# Patient Record
Sex: Female | Born: 1935 | Race: Black or African American | Hispanic: No
Health system: Southern US, Community
[De-identification: ages and names within clinical notes are randomized; demographics above are authoritative.]

---

## 1997-10-30 ENCOUNTER — Ambulatory Visit (HOSPITAL_COMMUNITY): Admission: RE | Admit: 1997-10-30 | Discharge: 1997-10-30 | Payer: Self-pay | Admitting: General Surgery

## 1998-05-19 ENCOUNTER — Encounter (HOSPITAL_BASED_OUTPATIENT_CLINIC_OR_DEPARTMENT_OTHER): Payer: Self-pay | Admitting: General Surgery

## 1998-05-19 ENCOUNTER — Ambulatory Visit (HOSPITAL_COMMUNITY): Admission: RE | Admit: 1998-05-19 | Discharge: 1998-05-19 | Payer: Self-pay | Admitting: General Surgery

## 1999-12-04 ENCOUNTER — Emergency Department (HOSPITAL_COMMUNITY): Admission: EM | Admit: 1999-12-04 | Discharge: 1999-12-04 | Payer: Self-pay | Admitting: Emergency Medicine

## 2000-01-18 ENCOUNTER — Ambulatory Visit (HOSPITAL_COMMUNITY): Admission: RE | Admit: 2000-01-18 | Discharge: 2000-01-18 | Payer: Self-pay | Admitting: *Deleted

## 2000-01-18 ENCOUNTER — Encounter: Payer: Self-pay | Admitting: *Deleted

## 2001-12-20 ENCOUNTER — Encounter: Payer: Self-pay | Admitting: Orthopedic Surgery

## 2001-12-20 ENCOUNTER — Encounter: Admission: RE | Admit: 2001-12-20 | Discharge: 2001-12-20 | Payer: Self-pay | Admitting: Orthopedic Surgery

## 2002-01-18 ENCOUNTER — Encounter: Payer: Self-pay | Admitting: Neurosurgery

## 2002-01-18 ENCOUNTER — Encounter: Admission: RE | Admit: 2002-01-18 | Discharge: 2002-01-18 | Payer: Self-pay | Admitting: Neurosurgery

## 2002-02-03 ENCOUNTER — Emergency Department (HOSPITAL_COMMUNITY): Admission: EM | Admit: 2002-02-03 | Discharge: 2002-02-04 | Payer: Self-pay | Admitting: Emergency Medicine

## 2002-02-20 ENCOUNTER — Encounter: Admission: RE | Admit: 2002-02-20 | Discharge: 2002-02-20 | Payer: Self-pay | Admitting: Neurosurgery

## 2002-02-20 ENCOUNTER — Encounter: Payer: Self-pay | Admitting: Neurosurgery

## 2002-03-06 ENCOUNTER — Encounter: Admission: RE | Admit: 2002-03-06 | Discharge: 2002-03-06 | Payer: Self-pay | Admitting: Neurosurgery

## 2002-03-06 ENCOUNTER — Encounter: Payer: Self-pay | Admitting: Neurosurgery

## 2002-04-16 ENCOUNTER — Encounter: Payer: Self-pay | Admitting: *Deleted

## 2002-04-16 ENCOUNTER — Ambulatory Visit (HOSPITAL_COMMUNITY): Admission: RE | Admit: 2002-04-16 | Discharge: 2002-04-16 | Payer: Self-pay | Admitting: *Deleted

## 2002-04-26 ENCOUNTER — Encounter: Admission: RE | Admit: 2002-04-26 | Discharge: 2002-04-26 | Payer: Self-pay | Admitting: *Deleted

## 2002-04-26 ENCOUNTER — Encounter: Payer: Self-pay | Admitting: *Deleted

## 2002-09-25 ENCOUNTER — Encounter: Payer: Self-pay | Admitting: Neurosurgery

## 2002-09-25 ENCOUNTER — Ambulatory Visit (HOSPITAL_COMMUNITY): Admission: RE | Admit: 2002-09-25 | Discharge: 2002-09-25 | Payer: Self-pay | Admitting: Neurosurgery

## 2003-06-01 ENCOUNTER — Encounter: Admission: RE | Admit: 2003-06-01 | Discharge: 2003-06-01 | Payer: Self-pay | Admitting: Orthopedic Surgery

## 2004-11-05 ENCOUNTER — Encounter (HOSPITAL_COMMUNITY): Admission: RE | Admit: 2004-11-05 | Discharge: 2004-11-09 | Payer: Self-pay | Admitting: Nephrology

## 2004-12-27 ENCOUNTER — Emergency Department (HOSPITAL_COMMUNITY): Admission: EM | Admit: 2004-12-27 | Discharge: 2004-12-27 | Payer: Self-pay | Admitting: Emergency Medicine

## 2005-05-04 ENCOUNTER — Encounter (HOSPITAL_COMMUNITY): Admission: RE | Admit: 2005-05-04 | Discharge: 2005-08-02 | Payer: Self-pay | Admitting: Nephrology

## 2007-03-21 ENCOUNTER — Encounter: Admission: RE | Admit: 2007-03-21 | Discharge: 2007-03-21 | Payer: Self-pay | Admitting: Orthopedic Surgery

## 2007-03-22 ENCOUNTER — Ambulatory Visit (HOSPITAL_BASED_OUTPATIENT_CLINIC_OR_DEPARTMENT_OTHER): Admission: RE | Admit: 2007-03-22 | Discharge: 2007-03-22 | Payer: Self-pay | Admitting: Orthopedic Surgery

## 2008-02-29 ENCOUNTER — Ambulatory Visit: Payer: Self-pay | Admitting: Hematology

## 2008-03-13 ENCOUNTER — Ambulatory Visit (HOSPITAL_COMMUNITY): Admission: RE | Admit: 2008-03-13 | Discharge: 2008-03-13 | Payer: Self-pay | Admitting: Unknown Physician Specialty

## 2008-03-13 LAB — CBC & DIFF AND RETIC
Eosinophils Absolute: 0.1 10*3/uL (ref 0.0–0.5)
MONO#: 0.4 10*3/uL (ref 0.1–0.9)
NEUT#: 6.4 10*3/uL (ref 1.5–6.5)
RBC: 3.19 10*6/uL — ABNORMAL LOW (ref 3.70–5.32)
RDW: 13.8 % (ref 11.3–14.5)
RETIC #: 43.4 10*3/uL (ref 19.7–115.1)
Retic %: 1.4 % (ref 0.4–2.3)
WBC: 8 10*3/uL (ref 3.9–10.0)
lymph#: 1.1 10*3/uL (ref 0.9–3.3)

## 2008-03-13 LAB — MORPHOLOGY: PLT EST: ADEQUATE

## 2008-03-16 LAB — PROTEIN, URINE, 24 HOUR
Collection Interval-UPROT: 24 hours
Protein, 24H Urine: 10 mg/d — ABNORMAL LOW (ref 50–100)
Urine Total Volume-UPROT: 500 mL

## 2008-03-16 LAB — KAPPA/LAMBDA LIGHT CHAINS: Kappa free light chain: 1.3 mg/dL (ref 0.33–1.94)

## 2008-03-16 LAB — SPEP & IFE WITH QIG
Alpha-1-Globulin: 4.8 % (ref 2.9–4.9)
Gamma Globulin: 8.1 % — ABNORMAL LOW (ref 11.1–18.8)
IgA: 54 mg/dL — ABNORMAL LOW (ref 68–378)
IgM, Serum: 10 mg/dL — ABNORMAL LOW (ref 60–263)

## 2008-03-16 LAB — COMPREHENSIVE METABOLIC PANEL
AST: 16 U/L (ref 0–37)
Albumin: 4.7 g/dL (ref 3.5–5.2)
Alkaline Phosphatase: 56 U/L (ref 39–117)
BUN: 20 mg/dL (ref 6–23)
Potassium: 4.5 mEq/L (ref 3.5–5.3)
Sodium: 142 mEq/L (ref 135–145)
Total Protein: 6.2 g/dL (ref 6.0–8.3)

## 2008-03-16 LAB — METHYLMALONIC ACID, SERUM: Methylmalonic Acid, Quantitative: 1600 nmol/L — ABNORMAL HIGH (ref 87–318)

## 2008-03-16 LAB — IRON AND TIBC: Iron: 53 ug/dL (ref 42–145)

## 2008-03-16 LAB — CREATININE CLEARANCE, URINE, 24 HOUR
Collection Interval-CRCL: 24 hours
Creatinine Clearance: 12 mL/min — ABNORMAL LOW (ref 75–115)

## 2008-03-16 LAB — FERRITIN: Ferritin: 758 ng/mL — ABNORMAL HIGH (ref 10–291)

## 2008-03-22 ENCOUNTER — Encounter (HOSPITAL_COMMUNITY): Admission: RE | Admit: 2008-03-22 | Discharge: 2008-06-20 | Payer: Self-pay | Admitting: Unknown Physician Specialty

## 2008-03-22 LAB — TYPE & CROSSMATCH - CHCC

## 2008-04-01 ENCOUNTER — Encounter: Admission: RE | Admit: 2008-04-01 | Discharge: 2008-04-01 | Payer: Self-pay | Admitting: Oncology

## 2008-04-16 ENCOUNTER — Ambulatory Visit: Payer: Self-pay | Admitting: Hematology

## 2008-04-18 LAB — COMPREHENSIVE METABOLIC PANEL
ALT: 9 U/L (ref 0–35)
AST: 15 U/L (ref 0–37)
CO2: 22 mEq/L (ref 19–32)
Chloride: 110 mEq/L (ref 96–112)
Creatinine, Ser: 1.87 mg/dL — ABNORMAL HIGH (ref 0.40–1.20)
Sodium: 142 mEq/L (ref 135–145)
Total Bilirubin: 0.5 mg/dL (ref 0.3–1.2)
Total Protein: 6.6 g/dL (ref 6.0–8.3)

## 2008-04-18 LAB — CBC WITH DIFFERENTIAL/PLATELET
BASO%: 0.5 % (ref 0.0–2.0)
EOS%: 5.8 % (ref 0.0–7.0)
LYMPH%: 24.7 % (ref 14.0–48.0)
MCH: 30.1 pg (ref 26.0–34.0)
MCHC: 34 g/dL (ref 32.0–36.0)
MONO#: 0.6 10*3/uL (ref 0.1–0.9)
NEUT%: 61.4 % (ref 39.6–76.8)
RBC: 4.16 10*6/uL (ref 3.70–5.32)
WBC: 7.9 10*3/uL (ref 3.9–10.0)
lymph#: 1.9 10*3/uL (ref 0.9–3.3)

## 2008-05-20 LAB — CBC WITH DIFFERENTIAL/PLATELET
BASO%: 0.5 % (ref 0.0–2.0)
EOS%: 3.4 % (ref 0.0–7.0)
HCT: 33.3 % — ABNORMAL LOW (ref 34.8–46.6)
MCHC: 34.4 g/dL (ref 32.0–36.0)
MONO#: 0.3 10*3/uL (ref 0.1–0.9)
RBC: 3.78 10*6/uL (ref 3.70–5.32)
RDW: 14.3 % (ref 11.3–14.5)
WBC: 6 10*3/uL (ref 3.9–10.0)
lymph#: 1.3 10*3/uL (ref 0.9–3.3)

## 2008-05-20 LAB — COMPREHENSIVE METABOLIC PANEL
ALT: <8 U/L (ref 0–35)
AST: 17 U/L (ref 0–37)
CO2: 18 mEq/L — ABNORMAL LOW (ref 19–32)
Calcium: 9.1 mg/dL (ref 8.4–10.5)
Chloride: 114 mEq/L — ABNORMAL HIGH (ref 96–112)
Potassium: 4.2 mEq/L (ref 3.5–5.3)
Sodium: 146 mEq/L — ABNORMAL HIGH (ref 135–145)
Total Protein: 6.5 g/dL (ref 6.0–8.3)

## 2008-06-11 ENCOUNTER — Ambulatory Visit: Payer: Self-pay | Admitting: Oncology

## 2008-06-13 LAB — CBC WITH DIFFERENTIAL/PLATELET
BASO%: 0.4 % (ref 0.0–2.0)
Basophils Absolute: 0 10*3/uL (ref 0.0–0.1)
HCT: 30.6 % — ABNORMAL LOW (ref 34.8–46.6)
HGB: 10.4 g/dL — ABNORMAL LOW (ref 11.6–15.9)
MCHC: 34.2 g/dL (ref 32.0–36.0)
MONO#: 0.3 10*3/uL (ref 0.1–0.9)
NEUT%: 74.2 % (ref 39.6–76.8)
RDW: 14.3 % (ref 11.3–14.5)
WBC: 5.8 10*3/uL (ref 3.9–10.0)
lymph#: 0.9 10*3/uL (ref 0.9–3.3)

## 2008-06-14 LAB — COMPREHENSIVE METABOLIC PANEL
ALT: 10 U/L (ref 0–35)
Albumin: 4.7 g/dL (ref 3.5–5.2)
CO2: 21 mEq/L (ref 19–32)
Calcium: 9.1 mg/dL (ref 8.4–10.5)
Chloride: 109 mEq/L (ref 96–112)
Creatinine, Ser: 1.8 mg/dL — ABNORMAL HIGH (ref 0.40–1.20)
Potassium: 3.9 mEq/L (ref 3.5–5.3)
Total Protein: 6.5 g/dL (ref 6.0–8.3)

## 2008-07-25 LAB — CBC WITH DIFFERENTIAL/PLATELET
BASO%: 0.5 % (ref 0.0–2.0)
EOS%: 4.2 % (ref 0.0–7.0)
HCT: 29.7 % — ABNORMAL LOW (ref 34.8–46.6)
HGB: 10.1 g/dL — ABNORMAL LOW (ref 11.6–15.9)
LYMPH%: 19.8 % (ref 14.0–48.0)
MCV: 91.3 fL (ref 81.0–101.0)
NEUT#: 4.3 10*3/uL (ref 1.5–6.5)
NEUT%: 70.1 % (ref 39.6–76.8)
RDW: 13.2 % (ref 11.3–14.5)

## 2008-07-25 LAB — BASIC METABOLIC PANEL
CO2: 19 mEq/L (ref 19–32)
Calcium: 9.2 mg/dL (ref 8.4–10.5)
Creatinine, Ser: 1.98 mg/dL — ABNORMAL HIGH (ref 0.40–1.20)
Sodium: 142 mEq/L (ref 135–145)

## 2008-08-26 ENCOUNTER — Ambulatory Visit: Payer: Self-pay | Admitting: Oncology

## 2008-08-28 LAB — COMPREHENSIVE METABOLIC PANEL
Albumin: 4.4 g/dL (ref 3.5–5.2)
Alkaline Phosphatase: 65 U/L (ref 39–117)
BUN: 30 mg/dL — ABNORMAL HIGH (ref 6–23)
Creatinine, Ser: 1.88 mg/dL — ABNORMAL HIGH (ref 0.40–1.20)
Glucose, Bld: 80 mg/dL (ref 70–99)
Potassium: 4 mEq/L (ref 3.5–5.3)
Total Bilirubin: 0.3 mg/dL (ref 0.3–1.2)

## 2008-08-28 LAB — CBC WITH DIFFERENTIAL/PLATELET
Basophils Absolute: 0 10*3/uL (ref 0.0–0.1)
Eosinophils Absolute: 0.2 10*3/uL (ref 0.0–0.5)
HCT: 29.9 % — ABNORMAL LOW (ref 34.8–46.6)
HGB: 10.2 g/dL — ABNORMAL LOW (ref 11.6–15.9)
LYMPH%: 15.9 % (ref 14.0–49.7)
MCH: 31.2 pg (ref 25.1–34.0)
MCV: 91.4 fL (ref 79.5–101.0)
MONO%: 5 % (ref 0.0–14.0)
NEUT#: 5.4 10*3/uL (ref 1.5–6.5)
NEUT%: 75.7 % (ref 38.4–76.8)
Platelets: 142 10*3/uL — ABNORMAL LOW (ref 145–400)

## 2008-11-22 ENCOUNTER — Ambulatory Visit: Payer: Self-pay | Admitting: Oncology

## 2008-12-02 LAB — CBC WITH DIFFERENTIAL/PLATELET
Basophils Absolute: 0 10*3/uL (ref 0.0–0.1)
Eosinophils Absolute: 0.3 10*3/uL (ref 0.0–0.5)
HCT: 30 % — ABNORMAL LOW (ref 34.8–46.6)
HGB: 10 g/dL — ABNORMAL LOW (ref 11.6–15.9)
NEUT#: 3.7 10*3/uL (ref 1.5–6.5)
NEUT%: 67.3 % (ref 38.4–76.8)
RDW: 13.4 % (ref 11.2–14.5)
lymph#: 1.2 10*3/uL (ref 0.9–3.3)

## 2009-02-25 ENCOUNTER — Ambulatory Visit: Payer: Self-pay | Admitting: Oncology

## 2009-03-07 LAB — CBC WITH DIFFERENTIAL/PLATELET
Basophils Absolute: 0 10*3/uL (ref 0.0–0.1)
Eosinophils Absolute: 0.5 10*3/uL (ref 0.0–0.5)
LYMPH%: 16.5 % (ref 14.0–49.7)
MCV: 90.4 fL (ref 79.5–101.0)
MONO%: 5.2 % (ref 0.0–14.0)
NEUT#: 4.5 10*3/uL (ref 1.5–6.5)
NEUT%: 70.8 % (ref 38.4–76.8)
Platelets: 157 10*3/uL (ref 145–400)
RBC: 3.62 10*6/uL — ABNORMAL LOW (ref 3.70–5.45)

## 2009-03-07 LAB — IRON AND TIBC
%SAT: 30 % (ref 20–55)
Iron: 72 ug/dL (ref 42–145)
TIBC: 238 ug/dL — ABNORMAL LOW (ref 250–470)
UIBC: 166 ug/dL

## 2009-03-07 LAB — COMPREHENSIVE METABOLIC PANEL
BUN: 25 mg/dL — ABNORMAL HIGH (ref 6–23)
CO2: 18 mEq/L — ABNORMAL LOW (ref 19–32)
Creatinine, Ser: 1.86 mg/dL — ABNORMAL HIGH (ref 0.40–1.20)
Glucose, Bld: 105 mg/dL — ABNORMAL HIGH (ref 70–99)
Total Bilirubin: 0.3 mg/dL (ref 0.3–1.2)

## 2009-03-07 LAB — FERRITIN: Ferritin: 735 ng/mL — ABNORMAL HIGH (ref 10–291)

## 2009-03-07 LAB — CHCC SMEAR

## 2009-07-07 ENCOUNTER — Ambulatory Visit: Payer: Self-pay | Admitting: Oncology

## 2009-07-17 LAB — COMPREHENSIVE METABOLIC PANEL
AST: 20 U/L (ref 0–37)
BUN: 24 mg/dL — ABNORMAL HIGH (ref 6–23)
Calcium: 8.6 mg/dL (ref 8.4–10.5)
Chloride: 113 mEq/L — ABNORMAL HIGH (ref 96–112)
Creatinine, Ser: 1.95 mg/dL — ABNORMAL HIGH (ref 0.40–1.20)
Total Bilirubin: 0.4 mg/dL (ref 0.3–1.2)

## 2009-07-17 LAB — CBC WITH DIFFERENTIAL/PLATELET
Basophils Absolute: 0 10*3/uL (ref 0.0–0.1)
EOS%: 3.5 % (ref 0.0–7.0)
HCT: 33 % — ABNORMAL LOW (ref 34.8–46.6)
HGB: 11.1 g/dL — ABNORMAL LOW (ref 11.6–15.9)
LYMPH%: 18.1 % (ref 14.0–49.7)
MCH: 30.6 pg (ref 25.1–34.0)
MCV: 90.9 fL (ref 79.5–101.0)
MONO%: 5.2 % (ref 0.0–14.0)
NEUT%: 72.8 % (ref 38.4–76.8)
Platelets: 181 10*3/uL (ref 145–400)
lymph#: 1.2 10*3/uL (ref 0.9–3.3)

## 2009-07-17 LAB — TSH: TSH: 0.202 u[IU]/mL — ABNORMAL LOW (ref 0.350–4.500)

## 2009-09-28 ENCOUNTER — Encounter: Admission: RE | Admit: 2009-09-28 | Discharge: 2009-09-28 | Payer: Self-pay | Admitting: Orthopedic Surgery

## 2009-11-25 ENCOUNTER — Ambulatory Visit: Payer: Self-pay | Admitting: Oncology

## 2009-12-05 LAB — CBC WITH DIFFERENTIAL/PLATELET
BASO%: 0.5 % (ref 0.0–2.0)
HCT: 31.3 % — ABNORMAL LOW (ref 34.8–46.6)
MCHC: 34.5 g/dL (ref 31.5–36.0)
MONO#: 0.5 10*3/uL (ref 0.1–0.9)
NEUT#: 5.4 10*3/uL (ref 1.5–6.5)
NEUT%: 70.8 % (ref 38.4–76.8)
RBC: 3.44 10*6/uL — ABNORMAL LOW (ref 3.70–5.45)
WBC: 7.6 10*3/uL (ref 3.9–10.3)
lymph#: 1.3 10*3/uL (ref 0.9–3.3)

## 2009-12-05 LAB — COMPREHENSIVE METABOLIC PANEL
ALT: <8 U/L (ref 0–35)
CO2: 18 mEq/L — ABNORMAL LOW (ref 19–32)
Calcium: 9.2 mg/dL (ref 8.4–10.5)
Chloride: 111 mEq/L (ref 96–112)
Sodium: 141 mEq/L (ref 135–145)
Total Protein: 6.4 g/dL (ref 6.0–8.3)

## 2009-12-05 LAB — IRON AND TIBC: Iron: 80 ug/dL (ref 42–145)

## 2010-02-22 ENCOUNTER — Emergency Department (HOSPITAL_COMMUNITY): Admission: EM | Admit: 2010-02-22 | Discharge: 2010-02-22 | Payer: Self-pay | Admitting: Family Medicine

## 2010-04-30 ENCOUNTER — Ambulatory Visit: Payer: Self-pay | Admitting: Oncology

## 2010-05-17 ENCOUNTER — Ambulatory Visit: Payer: Self-pay | Admitting: Internal Medicine

## 2010-05-17 ENCOUNTER — Inpatient Hospital Stay (HOSPITAL_COMMUNITY)
Admission: EM | Admit: 2010-05-17 | Discharge: 2010-05-28 | Disposition: E | Payer: Self-pay | Source: Home / Self Care | Admitting: Emergency Medicine

## 2010-05-17 ENCOUNTER — Ambulatory Visit: Payer: Self-pay | Admitting: Pulmonary Disease

## 2010-05-28 DEATH — deceased

## 2010-09-08 LAB — BASIC METABOLIC PANEL WITH GFR
BUN: 24 mg/dL — ABNORMAL HIGH (ref 6–23)
BUN: 25 mg/dL — ABNORMAL HIGH (ref 6–23)
BUN: 26 mg/dL — ABNORMAL HIGH (ref 6–23)
BUN: 26 mg/dL — ABNORMAL HIGH (ref 6–23)
CO2: 10 meq/L — ABNORMAL LOW (ref 19–32)
CO2: 11 meq/L — ABNORMAL LOW (ref 19–32)
CO2: 7 meq/L — CL (ref 19–32)
CO2: 9 meq/L — CL (ref 19–32)
Calcium: 6.9 mg/dL — ABNORMAL LOW (ref 8.4–10.5)
Calcium: 7.2 mg/dL — ABNORMAL LOW (ref 8.4–10.5)
Calcium: 7.2 mg/dL — ABNORMAL LOW (ref 8.4–10.5)
Calcium: 7.3 mg/dL — ABNORMAL LOW (ref 8.4–10.5)
Chloride: 117 meq/L — ABNORMAL HIGH (ref 96–112)
Chloride: 119 meq/L — ABNORMAL HIGH (ref 96–112)
Chloride: 120 meq/L — ABNORMAL HIGH (ref 96–112)
Chloride: 120 meq/L — ABNORMAL HIGH (ref 96–112)
Creatinine, Ser: 2.43 mg/dL — ABNORMAL HIGH (ref 0.4–1.2)
Creatinine, Ser: 2.52 mg/dL — ABNORMAL HIGH (ref 0.4–1.2)
Creatinine, Ser: 2.6 mg/dL — ABNORMAL HIGH (ref 0.4–1.2)
Creatinine, Ser: 2.8 mg/dL — ABNORMAL HIGH (ref 0.4–1.2)
GFR calc Af Amer: 19 mL/min — ABNORMAL LOW (ref >60)
GFR calc Af Amer: 21 mL/min — ABNORMAL LOW (ref >60)
GFR calc Af Amer: 22 mL/min — ABNORMAL LOW (ref >60)
GFR calc Af Amer: 23 mL/min — ABNORMAL LOW (ref >60)
GFR calc non Af Amer: 16 mL/min — ABNORMAL LOW (ref >60)
GFR calc non Af Amer: 18 mL/min — ABNORMAL LOW (ref >60)
GFR calc non Af Amer: 18 mL/min — ABNORMAL LOW (ref >60)
GFR calc non Af Amer: 19 mL/min — ABNORMAL LOW (ref >60)
Glucose, Bld: 259 mg/dL — ABNORMAL HIGH (ref 70–99)
Glucose, Bld: 306 mg/dL — ABNORMAL HIGH (ref 70–99)
Glucose, Bld: 313 mg/dL — ABNORMAL HIGH (ref 70–99)
Glucose, Bld: 360 mg/dL — ABNORMAL HIGH (ref 70–99)
Potassium: 3.3 meq/L — ABNORMAL LOW (ref 3.5–5.1)
Potassium: 3.8 meq/L (ref 3.5–5.1)
Potassium: 4 meq/L (ref 3.5–5.1)
Potassium: 4.1 meq/L (ref 3.5–5.1)
Sodium: 140 meq/L (ref 135–145)
Sodium: 140 meq/L (ref 135–145)
Sodium: 140 meq/L (ref 135–145)
Sodium: 141 meq/L (ref 135–145)

## 2010-09-08 LAB — BLOOD GAS, ARTERIAL
Acid-base deficit: 20.7 mmol/L — ABNORMAL HIGH (ref 0.0–2.0)
Bicarbonate: 7.4 mEq/L — ABNORMAL LOW (ref 20.0–24.0)
Drawn by: 29925
FIO2: 0.8 %
MECHVT: 450 mL
MECHVT: 450 mL
O2 Saturation: 97.4 %
O2 Saturation: 99.2 %
PEEP: 5 cmH2O
PEEP: 5 cmH2O
Patient temperature: 90.8
Patient temperature: 91.4
RATE: 12 resp/min
TCO2: 8.4 mmol/L (ref 0–100)

## 2010-09-08 LAB — PROCALCITONIN: Procalcitonin: 0.29 ng/mL

## 2010-09-08 LAB — CROSSMATCH
ABO/RH(D): O POS
Antibody Screen: NEGATIVE
Unit division: 0
Unit division: 0

## 2010-09-08 LAB — GLUCOSE, CAPILLARY
Glucose-Capillary: 166 mg/dL — ABNORMAL HIGH (ref 70–99)
Glucose-Capillary: 173 mg/dL — ABNORMAL HIGH (ref 70–99)
Glucose-Capillary: 173 mg/dL — ABNORMAL HIGH (ref 70–99)
Glucose-Capillary: 177 mg/dL — ABNORMAL HIGH (ref 70–99)
Glucose-Capillary: 181 mg/dL — ABNORMAL HIGH (ref 70–99)
Glucose-Capillary: 188 mg/dL — ABNORMAL HIGH (ref 70–99)
Glucose-Capillary: 193 mg/dL — ABNORMAL HIGH (ref 70–99)
Glucose-Capillary: 198 mg/dL — ABNORMAL HIGH (ref 70–99)
Glucose-Capillary: 205 mg/dL — ABNORMAL HIGH (ref 70–99)
Glucose-Capillary: 206 mg/dL — ABNORMAL HIGH (ref 70–99)
Glucose-Capillary: 210 mg/dL — ABNORMAL HIGH (ref 70–99)
Glucose-Capillary: 217 mg/dL — ABNORMAL HIGH (ref 70–99)
Glucose-Capillary: 222 mg/dL — ABNORMAL HIGH (ref 70–99)
Glucose-Capillary: 232 mg/dL — ABNORMAL HIGH (ref 70–99)
Glucose-Capillary: 267 mg/dL — ABNORMAL HIGH (ref 70–99)
Glucose-Capillary: 284 mg/dL — ABNORMAL HIGH (ref 70–99)

## 2010-09-08 LAB — CBC
HCT: 31.9 % — ABNORMAL LOW (ref 36.0–46.0)
HCT: 34.5 % — ABNORMAL LOW (ref 36.0–46.0)
HCT: 35.2 % — ABNORMAL LOW (ref 36.0–46.0)
Hemoglobin: 10.8 g/dL — ABNORMAL LOW (ref 12.0–15.0)
Hemoglobin: 11.5 g/dL — ABNORMAL LOW (ref 12.0–15.0)
Hemoglobin: 12 g/dL (ref 12.0–15.0)
Hemoglobin: 6.6 g/dL — CL (ref 12.0–15.0)
MCH: 29.6 pg (ref 26.0–34.0)
MCH: 29.8 pg (ref 26.0–34.0)
MCH: 29.9 pg (ref 26.0–34.0)
MCH: 30.1 pg (ref 26.0–34.0)
MCHC: 33.3 g/dL (ref 30.0–36.0)
MCHC: 33.9 g/dL (ref 30.0–36.0)
MCHC: 34.1 g/dL (ref 30.0–36.0)
MCV: 87.9 fL (ref 78.0–100.0)
MCV: 88.2 fL (ref 78.0–100.0)
MCV: 88.8 fL (ref 78.0–100.0)
MCV: 89.8 fL (ref 78.0–100.0)
Platelets: 157 K/uL (ref 150–400)
Platelets: 163 K/uL (ref 150–400)
Platelets: 178 K/uL (ref 150–400)
RBC: 2.23 MIL/uL — ABNORMAL LOW (ref 3.87–5.11)
RBC: 3.63 MIL/uL — ABNORMAL LOW (ref 3.87–5.11)
RBC: 3.84 MIL/uL — ABNORMAL LOW (ref 3.87–5.11)
RBC: 3.99 MIL/uL (ref 3.87–5.11)
RDW: 13.1 % (ref 11.5–15.5)
RDW: 13.3 % (ref 11.5–15.5)
RDW: 13.5 % (ref 11.5–15.5)
WBC: 24.5 K/uL — ABNORMAL HIGH (ref 4.0–10.5)
WBC: 34.8 K/uL — ABNORMAL HIGH (ref 4.0–10.5)
WBC: 35.1 K/uL — ABNORMAL HIGH (ref 4.0–10.5)

## 2010-09-08 LAB — DIFFERENTIAL
Basophils Absolute: 0 10*3/uL (ref 0.0–0.1)
Eosinophils Absolute: 0 10*3/uL (ref 0.0–0.7)
Lymphs Abs: 1.9 10*3/uL (ref 0.7–4.0)
Monocytes Relative: 4 % (ref 3–12)

## 2010-09-08 LAB — BASIC METABOLIC PANEL
BUN: 25 mg/dL — ABNORMAL HIGH (ref 6–23)
BUN: 25 mg/dL — ABNORMAL HIGH (ref 6–23)
CO2: 10 mEq/L — ABNORMAL LOW (ref 19–32)
CO2: 10 mEq/L — ABNORMAL LOW (ref 19–32)
Calcium: 6.9 mg/dL — ABNORMAL LOW (ref 8.4–10.5)
Chloride: 117 mEq/L — ABNORMAL HIGH (ref 96–112)
Chloride: 117 mEq/L — ABNORMAL HIGH (ref 96–112)
Creatinine, Ser: 2.12 mg/dL — ABNORMAL HIGH (ref 0.4–1.2)
Creatinine, Ser: 2.74 mg/dL — ABNORMAL HIGH (ref 0.4–1.2)
GFR calc Af Amer: 27 mL/min — ABNORMAL LOW (ref >60)
GFR calc non Af Amer: 17 mL/min — ABNORMAL LOW (ref >60)
GFR calc non Af Amer: 20 mL/min — ABNORMAL LOW (ref >60)
Glucose, Bld: 356 mg/dL — ABNORMAL HIGH (ref 70–99)
Potassium: 3.2 mEq/L — ABNORMAL LOW (ref 3.5–5.1)
Sodium: 137 mEq/L (ref 135–145)

## 2010-09-08 LAB — COMPREHENSIVE METABOLIC PANEL
AST: 557 U/L — ABNORMAL HIGH (ref 0–37)
BUN: 23 mg/dL (ref 6–23)
CO2: 10 mEq/L — ABNORMAL LOW (ref 19–32)
Chloride: 120 mEq/L — ABNORMAL HIGH (ref 96–112)
Creatinine, Ser: 2.04 mg/dL — ABNORMAL HIGH (ref 0.4–1.2)
GFR calc non Af Amer: 23 mL/min — ABNORMAL LOW (ref >60)
Total Bilirubin: 0.6 mg/dL (ref 0.3–1.2)

## 2010-09-08 LAB — LACTIC ACID, PLASMA: Lactic Acid, Venous: 7.3 mmol/L — ABNORMAL HIGH (ref 0.5–2.2)

## 2010-09-08 LAB — POCT I-STAT, CHEM 8
BUN: 27 mg/dL — ABNORMAL HIGH (ref 6–23)
Chloride: 113 mEq/L — ABNORMAL HIGH (ref 96–112)
Creatinine, Ser: 2.4 mg/dL — ABNORMAL HIGH (ref 0.4–1.2)
Potassium: 3.9 mEq/L (ref 3.5–5.1)
Sodium: 141 mEq/L (ref 135–145)
TCO2: 13 mmol/L (ref 0–100)

## 2010-09-08 LAB — PROTIME-INR
INR: 1.76 — ABNORMAL HIGH (ref 0.00–1.49)
Prothrombin Time: 20.7 s — ABNORMAL HIGH (ref 11.6–15.2)
Prothrombin Time: 22 seconds — ABNORMAL HIGH (ref 11.6–15.2)

## 2010-09-08 LAB — MAGNESIUM: Magnesium: 1.6 mg/dL (ref 1.5–2.5)

## 2010-09-08 LAB — CARBOXYHEMOGLOBIN
Carboxyhemoglobin: 0 % — ABNORMAL LOW (ref 0.5–1.5)
Methemoglobin: 0.5 % (ref 0.0–1.5)
O2 Saturation: 98.4 %
Total hemoglobin: 11.5 g/dL — ABNORMAL LOW (ref 12.5–16.0)
Total hemoglobin: 7.8 g/dL — ABNORMAL LOW (ref 12.5–16.0)

## 2010-09-08 LAB — CARDIAC PANEL(CRET KIN+CKTOT+MB+TROPI)
CK, MB: 418.1 ng/mL (ref 0.3–4.0)
CK, MB: 737.5 ng/mL (ref 0.3–4.0)
Relative Index: 13.4 — ABNORMAL HIGH (ref 0.0–2.5)
Relative Index: 19.9 — ABNORMAL HIGH (ref 0.0–2.5)
Total CK: 3704 U/L — ABNORMAL HIGH (ref 7–177)
Troponin I: 23.28 ng/mL (ref 0.00–0.06)
Troponin I: 60.21 ng/mL (ref 0.00–0.06)

## 2010-09-08 LAB — POCT CARDIAC MARKERS
CKMB, poc: 4.8 ng/mL (ref 1.0–8.0)
Myoglobin, poc: >500 ng/mL (ref 12–200)
Troponin i, poc: 0.13 ng/mL — ABNORMAL HIGH (ref 0.00–0.09)

## 2010-09-08 LAB — MRSA PCR SCREENING: MRSA by PCR: NEGATIVE

## 2010-09-08 LAB — TSH: TSH: 0.024 u[IU]/mL — ABNORMAL LOW (ref 0.350–4.500)

## 2010-09-08 LAB — ABO/RH: ABO/RH(D): O POS

## 2010-11-10 NOTE — Op Note (Signed)
[name], [name]             ACCOUNT NO.:  [id number]   MEDICAL RECORD NO.:  [id number]          PATIENT TYPE:  AMB   LOCATION:  DSC                          FACILITY:  [facility]   PHYSICIAN:  Artist Pais. Weingold, M.D.DATE OF BIRTH:  31-Oct-1922   DATE OF PROCEDURE:  03/22/2007  DATE OF DISCHARGE:                               OPERATIVE REPORT   PREOPERATIVE DIAGNOSIS:  Left wrist SLAC deformity.   POSTOPERATIVE DIAGNOSIS:  Left wrist SLAC deformity.   PROCEDURE:  Left wrist proximal row carpectomy with radial styloidectomy   SURGEON:  Artist Pais. Mina Marble, M.D.   ASSISTANT:  None.   ANESTHESIA:  General.   TOURNIQUET TIME:  50 minutes.   COMPLICATIONS:  None.   DRAINS:  None.   OPERATIVE REPORT:  The patient was taken to the operating suite. After  the induction of adequate general anesthesia, the left upper extremity  was prepped and draped in a sterile fashion.  An Esmarch was used to  exsanguinate the limb.  The tourniquet was inflated to 250 mmHg. At this  point in time, the midline incision was made over the wrist just ulnar  to the palpable border of Lister's tubercle.  The skin was incised.  The  second and fourth dorsal compartments were identified and retracted. The  dorsal capsule incision was made. The radiocarpal joint was identified.  At this point in time, the proximal row was removed in piecemeal using  rongeurs and curets, with care to preserve the capitate head.  Once the  proximal row was removed, the wound was thoroughly irrigated.  A radial  styloidectomy was performed.  Intraoperative fluoroscopy revealed good  placement of the capitate in the lunate fossa. The wound, again, was  thoroughly irrigated and then loosely closed. The capsular tissues were  closed with a 3-0 Ethibond in figure-of-eight sutures followed by skin  closure with 4-0 Vicryl Rapide in a subcuticular stitch.  Xeroform,  4x4s, fluffs and a volar splint was applied.  The patient  tolerated the  procedure well and went to the recovery room in good condition.      Artist Pais Mina Marble, M.D.  Electronically Signed     MAW/MEDQ  D:  03/22/2007  T:  03/22/2007  Job:  [id number]

## 2011-03-29 LAB — CROSSMATCH: ABO/RH(D): O POS

## 2011-03-30 IMAGING — CR DG CHEST 1V PORT
1 series · 1 of 1 positions shown · non-contrast
Comparison: 1 day prior

CLINICAL DATA: Hyperthermia.  Myocardial infarction.

PORTABLE CHEST - 1 VIEW

[view not recorded]
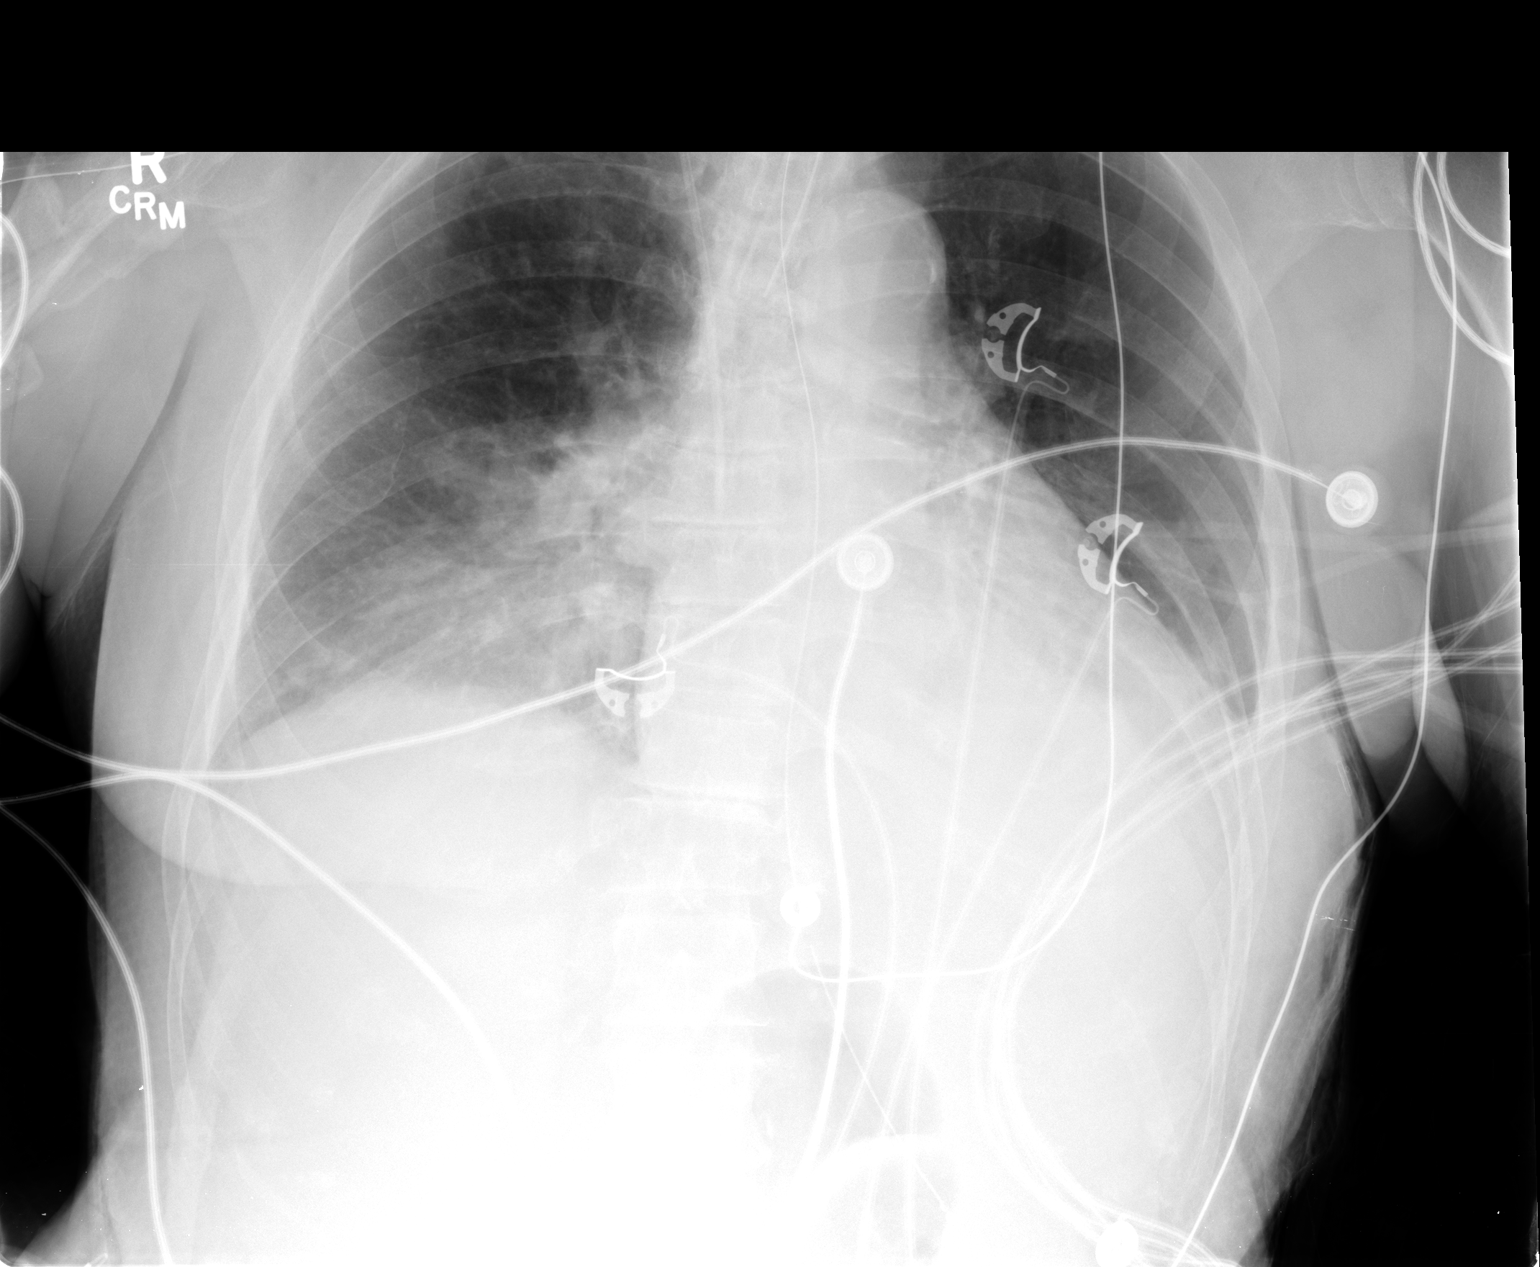

[1 of 1 positions shown; findings below may reference images not displayed]

FINDINGS: Endotracheal tube 2.6 cm above carina.  Right IJ central
line unchanged.  Nasogastric tube terminates at body of stomach.

Mild cardiomegaly.  Probable small left pleural effusion. No
pneumothorax.  Development of mild pulmonary venous congestion.
Decreased lung volumes.  Right greater than left bibasilar airspace
disease is new.
IMPRESSION: Decreased aeration.  Development of pulmonary venous congestion and
bibasilar airspace disease, which could represent atelectasis or
aspiration.  Given time course, infection is less favored.

## 2011-04-08 LAB — BASIC METABOLIC PANEL
BUN: 18
CO2: 21
Calcium: 9.3
Chloride: 104
Creatinine, Ser: 1.99 — ABNORMAL HIGH
GFR calc Af Amer: 29 — ABNORMAL LOW
GFR calc non Af Amer: 24 — ABNORMAL LOW
Glucose, Bld: 125 — ABNORMAL HIGH
Potassium: 4.3
Sodium: 134 — ABNORMAL LOW

## 2011-04-08 LAB — POCT HEMOGLOBIN-HEMACUE
Hemoglobin: 9.4 — ABNORMAL LOW
Operator id: 12362

## 2014-03-27 ENCOUNTER — Telehealth: Payer: Self-pay | Admitting: *Deleted

## 2014-03-27 NOTE — Telephone Encounter (Signed)
Opened in error

## 2019-05-30 ENCOUNTER — Encounter: Payer: Self-pay | Admitting: *Deleted

## 2021-03-03 ENCOUNTER — Other Ambulatory Visit (HOSPITAL_COMMUNITY): Payer: Self-pay

## 2022-08-20 ENCOUNTER — Other Ambulatory Visit (HOSPITAL_COMMUNITY): Payer: Self-pay
